# Patient Record
Sex: Female | Born: 1955 | Race: White | Hispanic: No | Marital: Married | Smoking: Never smoker
Health system: Southern US, Community
[De-identification: ages and names within clinical notes are randomized; demographics above are authoritative.]

## PROBLEM LIST (undated history)

## (undated) DIAGNOSIS — K589 Irritable bowel syndrome without diarrhea: Secondary | ICD-10-CM

## (undated) DIAGNOSIS — R896 Abnormal cytological findings in specimens from other organs, systems and tissues: Secondary | ICD-10-CM

## (undated) DIAGNOSIS — F419 Anxiety disorder, unspecified: Secondary | ICD-10-CM

## (undated) DIAGNOSIS — E7800 Pure hypercholesterolemia, unspecified: Secondary | ICD-10-CM

## (undated) DIAGNOSIS — I4510 Unspecified right bundle-branch block: Secondary | ICD-10-CM

## (undated) DIAGNOSIS — M8580 Other specified disorders of bone density and structure, unspecified site: Secondary | ICD-10-CM

## (undated) HISTORY — DX: Pure hypercholesterolemia, unspecified: E78.00

## (undated) HISTORY — DX: Other specified disorders of bone density and structure, unspecified site: M85.80

## (undated) HISTORY — DX: Irritable bowel syndrome, unspecified: K58.9

## (undated) HISTORY — DX: Anxiety disorder, unspecified: F41.9

## (undated) HISTORY — DX: Abnormal cytological findings in specimens from other organs, systems and tissues: R89.6

---

## 1997-10-05 ENCOUNTER — Other Ambulatory Visit: Admission: RE | Admit: 1997-10-05 | Discharge: 1997-10-05 | Payer: Self-pay | Admitting: Gynecology

## 1998-12-21 ENCOUNTER — Other Ambulatory Visit: Admission: RE | Admit: 1998-12-21 | Discharge: 1998-12-21 | Payer: Self-pay | Admitting: Gynecology

## 1999-06-13 ENCOUNTER — Other Ambulatory Visit: Admission: RE | Admit: 1999-06-13 | Discharge: 1999-06-13 | Payer: Self-pay | Admitting: Gynecology

## 2000-02-28 ENCOUNTER — Other Ambulatory Visit: Admission: RE | Admit: 2000-02-28 | Discharge: 2000-02-28 | Payer: Self-pay | Admitting: Gynecology

## 2000-09-11 ENCOUNTER — Other Ambulatory Visit: Admission: RE | Admit: 2000-09-11 | Discharge: 2000-09-11 | Payer: Self-pay | Admitting: Gynecology

## 2001-04-09 ENCOUNTER — Other Ambulatory Visit: Admission: RE | Admit: 2001-04-09 | Discharge: 2001-04-09 | Payer: Self-pay | Admitting: Gynecology

## 2002-04-10 ENCOUNTER — Other Ambulatory Visit: Admission: RE | Admit: 2002-04-10 | Discharge: 2002-04-10 | Payer: Self-pay | Admitting: Gynecology

## 2003-05-13 ENCOUNTER — Other Ambulatory Visit: Admission: RE | Admit: 2003-05-13 | Discharge: 2003-05-13 | Payer: Self-pay | Admitting: Gynecology

## 2003-11-04 ENCOUNTER — Other Ambulatory Visit: Admission: RE | Admit: 2003-11-04 | Discharge: 2003-11-04 | Payer: Self-pay | Admitting: Gynecology

## 2004-05-16 ENCOUNTER — Other Ambulatory Visit: Admission: RE | Admit: 2004-05-16 | Discharge: 2004-05-16 | Payer: Self-pay | Admitting: Gynecology

## 2004-11-25 ENCOUNTER — Other Ambulatory Visit: Admission: RE | Admit: 2004-11-25 | Discharge: 2004-11-25 | Payer: Self-pay | Admitting: Gynecology

## 2005-05-18 ENCOUNTER — Other Ambulatory Visit: Admission: RE | Admit: 2005-05-18 | Discharge: 2005-05-18 | Payer: Self-pay | Admitting: Gynecology

## 2006-09-06 ENCOUNTER — Other Ambulatory Visit: Admission: RE | Admit: 2006-09-06 | Discharge: 2006-09-06 | Payer: Self-pay | Admitting: Gynecology

## 2007-02-07 HISTORY — PX: COLONOSCOPY: SHX174

## 2007-09-09 ENCOUNTER — Other Ambulatory Visit: Admission: RE | Admit: 2007-09-09 | Discharge: 2007-09-09 | Payer: Self-pay | Admitting: Gynecology

## 2008-06-08 ENCOUNTER — Ambulatory Visit: Payer: Self-pay | Admitting: Radiology

## 2008-06-08 ENCOUNTER — Emergency Department (HOSPITAL_BASED_OUTPATIENT_CLINIC_OR_DEPARTMENT_OTHER): Admission: EM | Admit: 2008-06-08 | Discharge: 2008-06-08 | Payer: Self-pay | Admitting: Emergency Medicine

## 2008-09-16 ENCOUNTER — Other Ambulatory Visit: Admission: RE | Admit: 2008-09-16 | Discharge: 2008-09-16 | Payer: Self-pay | Admitting: Gynecology

## 2008-09-16 ENCOUNTER — Encounter: Payer: Self-pay | Admitting: Obstetrics and Gynecology

## 2008-09-16 ENCOUNTER — Ambulatory Visit: Payer: Self-pay | Admitting: Gynecology

## 2008-09-28 ENCOUNTER — Ambulatory Visit: Payer: Self-pay | Admitting: Gynecology

## 2009-09-20 ENCOUNTER — Other Ambulatory Visit: Admission: RE | Admit: 2009-09-20 | Discharge: 2009-09-20 | Payer: Self-pay | Admitting: Gynecology

## 2009-09-20 ENCOUNTER — Ambulatory Visit: Payer: Self-pay | Admitting: Gynecology

## 2009-12-21 ENCOUNTER — Ambulatory Visit: Payer: Self-pay | Admitting: Obstetrics and Gynecology

## 2010-05-15 IMAGING — CR DG CHEST 2V
2 series · 2 of 2 positions shown · non-contrast
Comparison: None

CLINICAL DATA: Heart palpitations/chest pain

CHEST - 2 VIEW

[w chest pa]
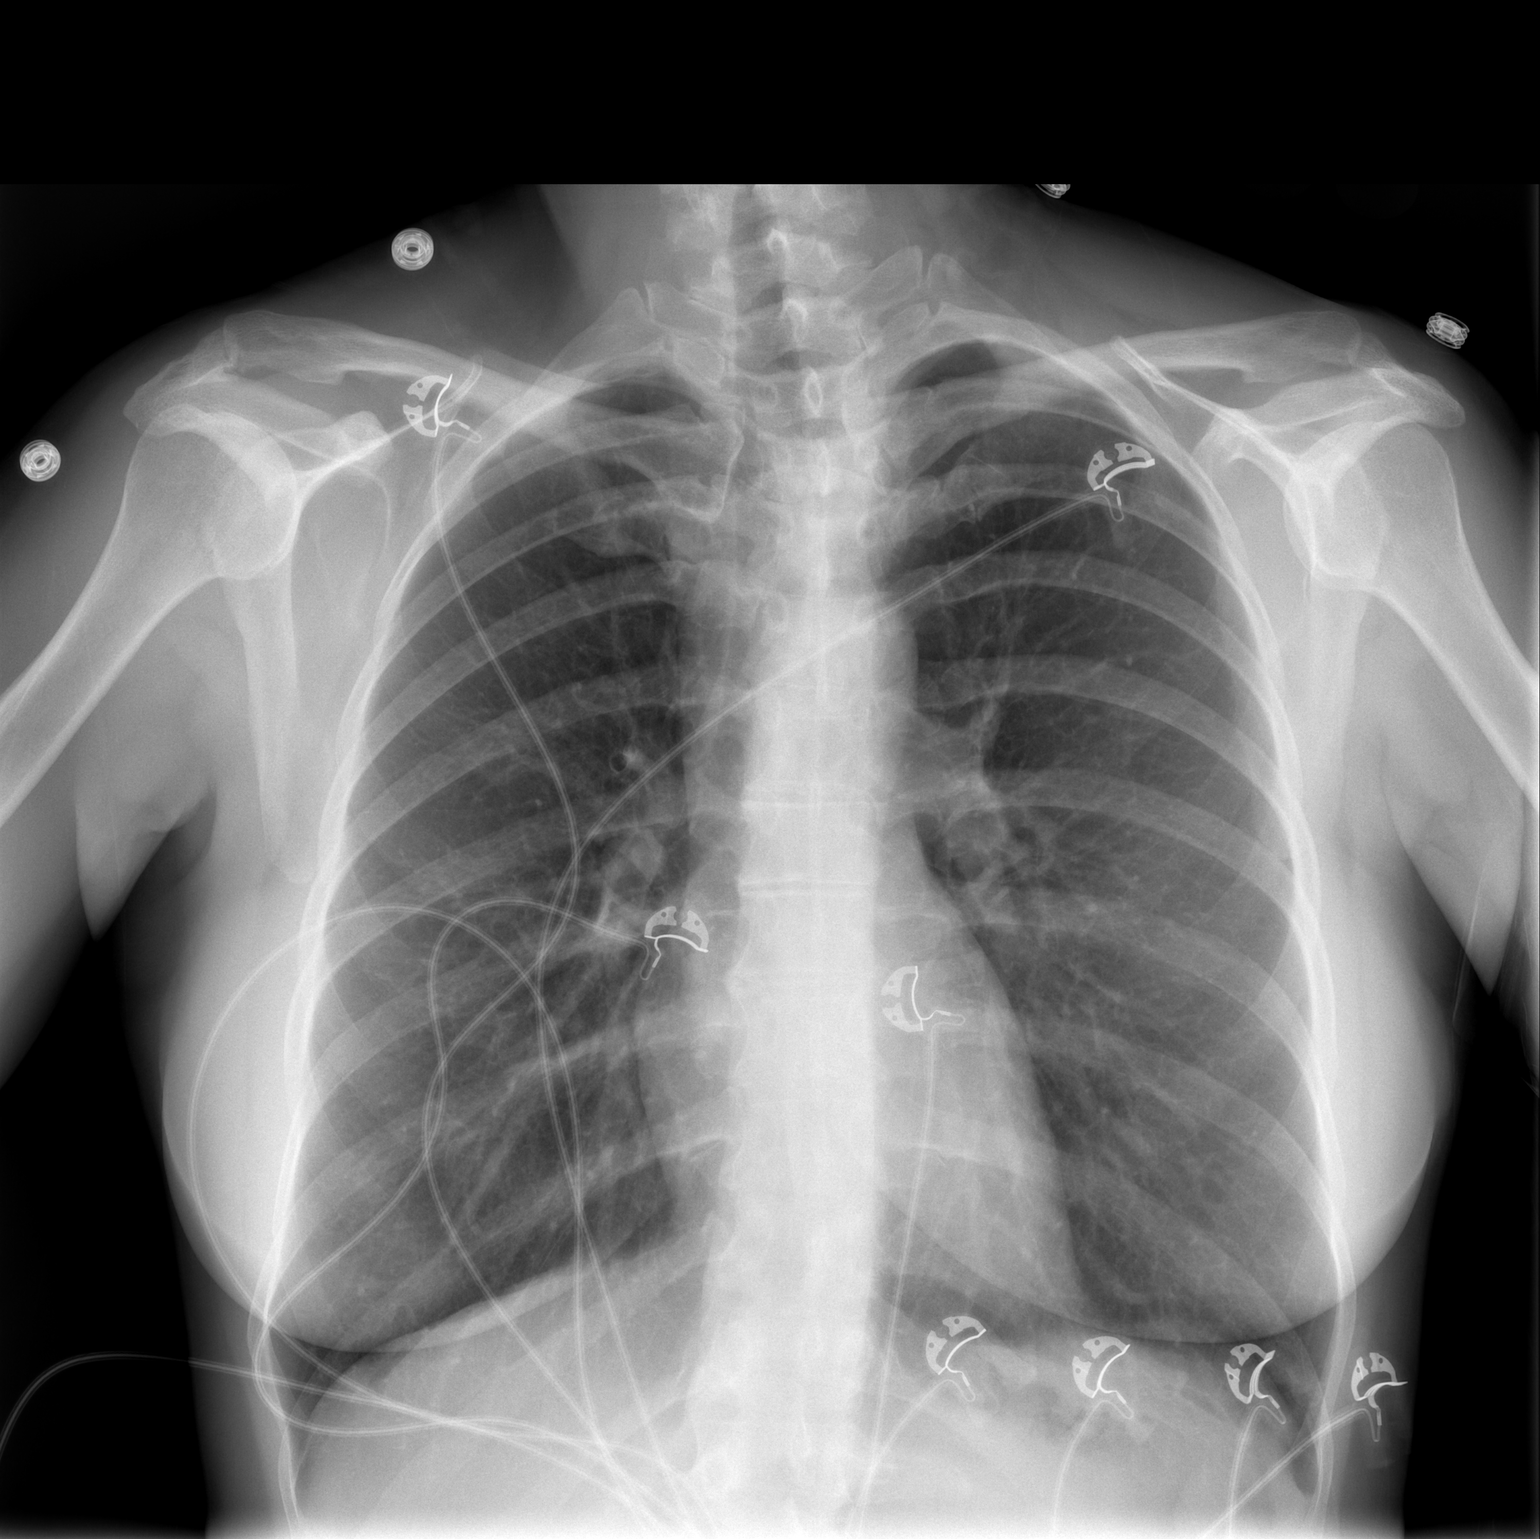

[w chest lat]
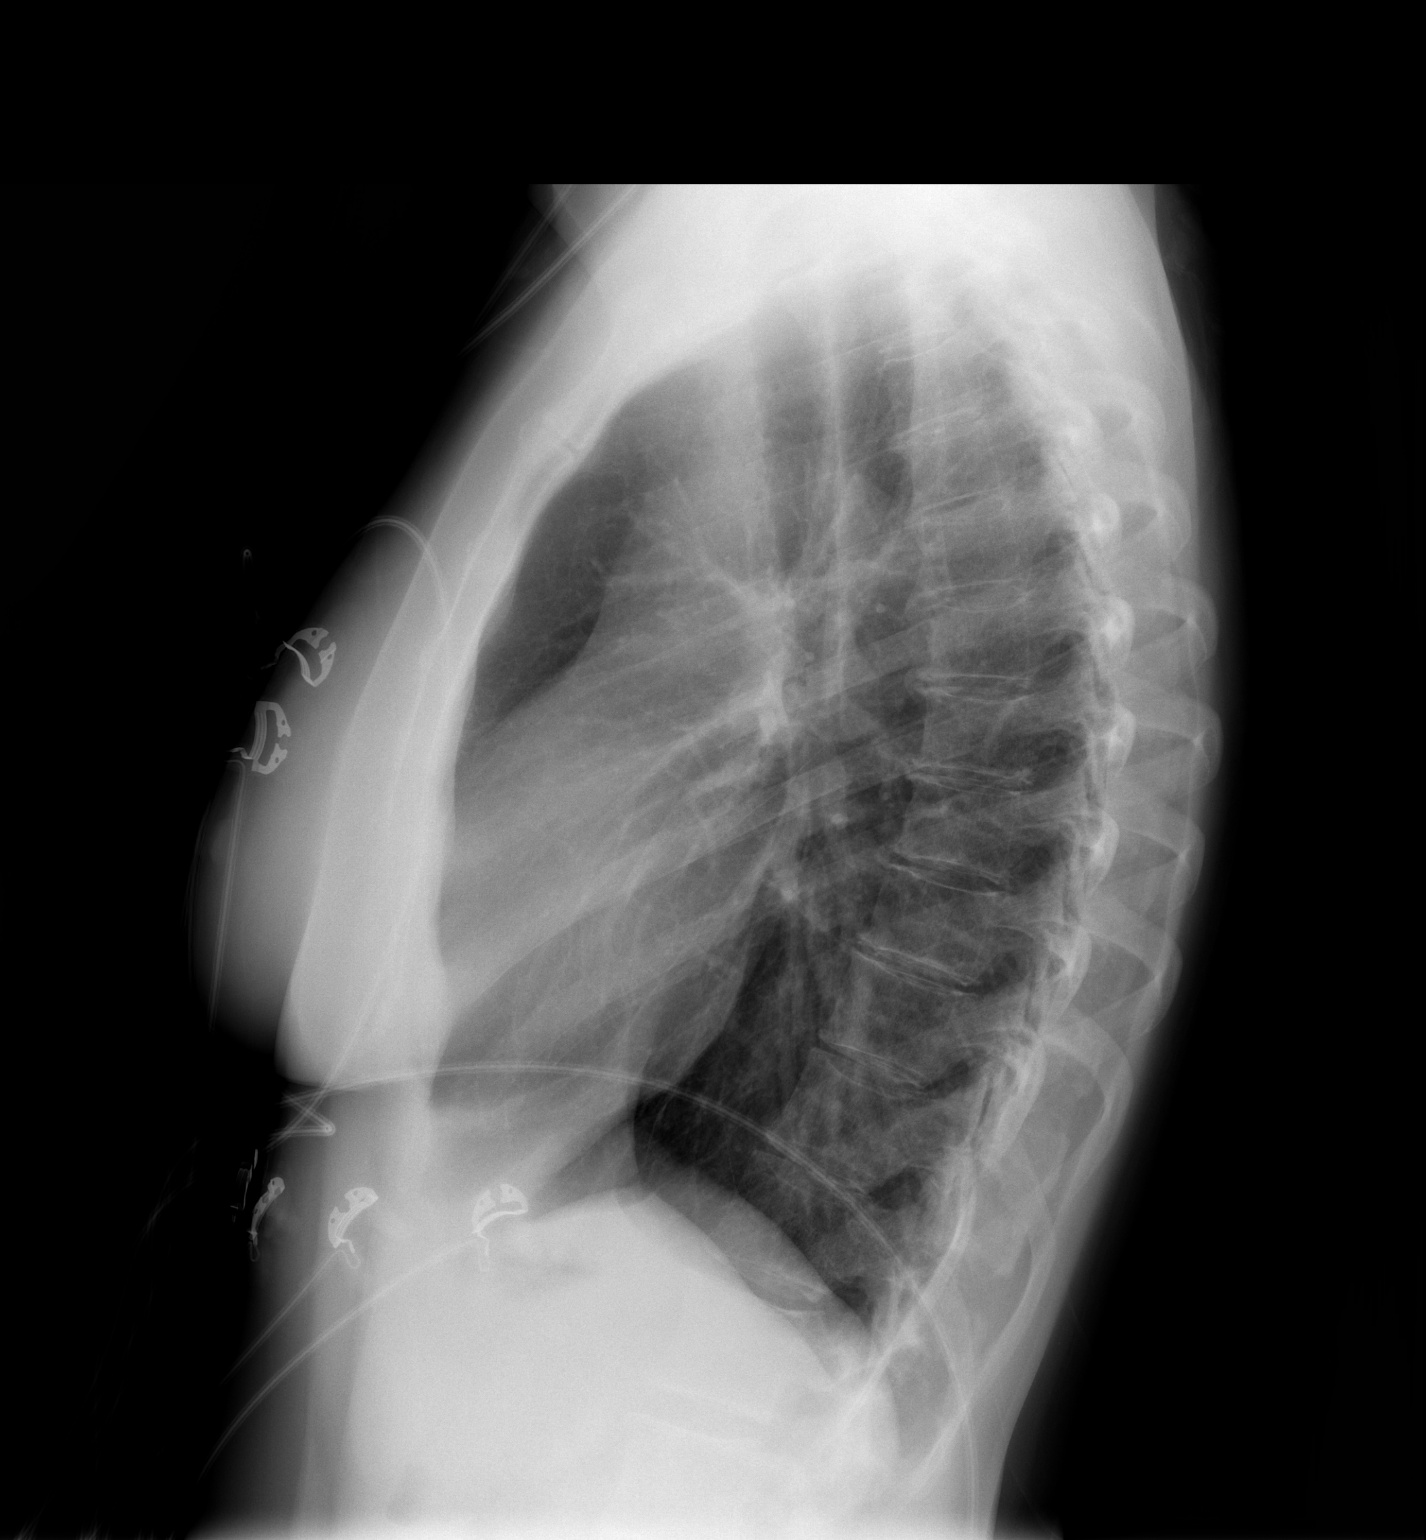

[2 of 2 positions shown; findings below may reference images not displayed]

FINDINGS: The heart and mediastinal contours normal.  Mild
peribronchial thickening without active airspace disease or pleural
fluid.
IMPRESSION: Mild peribronchial thickening - no active disease.

## 2010-05-17 LAB — POCT CARDIAC MARKERS
CKMB, poc: <1 ng/mL — ABNORMAL LOW (ref 1.0–8.0)
Myoglobin, poc: 44.9 ng/mL (ref 12–200)
Troponin i, poc: <0.05 ng/mL (ref 0.00–0.09)

## 2010-05-17 LAB — DIFFERENTIAL
Basophils Absolute: 0 K/uL (ref 0.0–0.1)
Basophils Relative: 0 % (ref 0–1)
Eosinophils Absolute: 0 10*3/uL (ref 0.0–0.7)
Eosinophils Relative: 0 % (ref 0–5)
Lymphocytes Relative: 8 % — ABNORMAL LOW (ref 12–46)
Lymphs Abs: 0.7 K/uL (ref 0.7–4.0)
Monocytes Absolute: 0.2 10*3/uL (ref 0.1–1.0)
Monocytes Relative: 2 % — ABNORMAL LOW (ref 3–12)
Neutro Abs: 7.9 K/uL — ABNORMAL HIGH (ref 1.7–7.7)
Neutrophils Relative %: 89 % — ABNORMAL HIGH (ref 43–77)

## 2010-05-17 LAB — BASIC METABOLIC PANEL WITH GFR
BUN: 14 mg/dL (ref 6–23)
Calcium: 9.7 mg/dL (ref 8.4–10.5)
GFR calc non Af Amer: >60 mL/min (ref >60)
Potassium: 3.9 meq/L (ref 3.5–5.1)
Sodium: 139 meq/L (ref 135–145)

## 2010-05-17 LAB — CBC
HCT: 41.4 % (ref 36.0–46.0)
Hemoglobin: 13.3 g/dL (ref 12.0–15.0)
MCHC: 32.1 g/dL (ref 30.0–36.0)
MCV: 81.1 fL (ref 78.0–100.0)
Platelets: 261 K/uL (ref 150–400)
RBC: 5.11 MIL/uL (ref 3.87–5.11)
RDW: 14.9 % (ref 11.5–15.5)
WBC: 8.8 K/uL (ref 4.0–10.5)

## 2010-05-17 LAB — BASIC METABOLIC PANEL
CO2: 28 mEq/L (ref 19–32)
Chloride: 100 mEq/L (ref 96–112)
Creatinine, Ser: 0.9 mg/dL (ref 0.4–1.2)
GFR calc Af Amer: >60 mL/min (ref >60)
Glucose, Bld: 156 mg/dL — ABNORMAL HIGH (ref 70–99)

## 2010-08-12 ENCOUNTER — Encounter: Payer: Self-pay | Admitting: *Deleted

## 2010-09-07 DIAGNOSIS — IMO0001 Reserved for inherently not codable concepts without codable children: Secondary | ICD-10-CM

## 2010-09-07 HISTORY — DX: Reserved for inherently not codable concepts without codable children: IMO0001

## 2010-09-22 ENCOUNTER — Encounter: Payer: Self-pay | Admitting: Gynecology

## 2010-09-22 ENCOUNTER — Other Ambulatory Visit (HOSPITAL_COMMUNITY)
Admission: RE | Admit: 2010-09-22 | Discharge: 2010-09-22 | Disposition: A | Discharge status: Home / Self Care | Payer: 59 | Source: Ambulatory Visit | Attending: Gynecology | Admitting: Gynecology

## 2010-09-22 ENCOUNTER — Ambulatory Visit (INDEPENDENT_AMBULATORY_CARE_PROVIDER_SITE_OTHER): Payer: 59 | Admitting: Gynecology

## 2010-09-22 VITALS — BP 116/70 | Ht 66.0 in | Wt 160.0 lb

## 2010-09-22 DIAGNOSIS — Z79890 Hormone replacement therapy (postmenopausal): Secondary | ICD-10-CM

## 2010-09-22 DIAGNOSIS — E7800 Pure hypercholesterolemia, unspecified: Secondary | ICD-10-CM | POA: Insufficient documentation

## 2010-09-22 DIAGNOSIS — Z1159 Encounter for screening for other viral diseases: Secondary | ICD-10-CM | POA: Insufficient documentation

## 2010-09-22 DIAGNOSIS — Z01419 Encounter for gynecological examination (general) (routine) without abnormal findings: Secondary | ICD-10-CM

## 2010-09-22 DIAGNOSIS — K589 Irritable bowel syndrome without diarrhea: Secondary | ICD-10-CM | POA: Insufficient documentation

## 2010-09-22 DIAGNOSIS — F419 Anxiety disorder, unspecified: Secondary | ICD-10-CM | POA: Insufficient documentation

## 2010-09-22 MED ORDER — ESTRADIOL-NORETHINDRONE ACET 1-0.5 MG PO TABS: 1.0000 | ORAL_TABLET | Freq: Every day | ORAL | Status: DC

## 2010-09-22 NOTE — Progress Notes (Signed)
Tanya Montoya 11-28-1955 [id number]        55 y.o.  for annual exam.  Primary physician started her about 9 months ago on Activella due to menopausal symptoms hot flushes sweats and emotional swings. Her husband is undergoing treatments for colon cancer and has surgery scheduled tomorrow at [facility]. His already been through radiation and chemotherapy. She is having a lot of anxiety dealing with this and is currently on Effexor and Prozac and Xanax through her primary.  Past medical history,surgical history, allergies, family history and social history were all reviewed and documented in the EPIC chart. ROS:  Was performed and pertinent positives and negatives are included in the history.  Exam: chaperone present Filed Vitals:   09/22/10 1214  BP: 116/70   General appearance  Normal Skin grossly normal Head/Neck normal with no cervical or supraclavicular adenopathy thyroid normal Lungs  clear Cardiac RR, without RMG Abdominal  soft, nontender, without masses, organomegaly or hernia Breasts  examined lying and sitting without masses, retractions, discharge or axillary adenopathy. Pelvic  Ext/BUS/vagina  normal mild atrophic changes  Cervix  normal  Pap done  Uterus  anteverted, normal size, shape and contour, midline and mobile nontender   Adnexa  Without masses or tenderness    Anus and perineum  normal   Rectovaginal  normal sphincter tone without palpated masses or tenderness.    Assessment/Plan:  55 y.o. female for annual exam.    #1 HRT. I discussed the risks benefits of HRT with her to include the WHI study, risks stroke heart attack DVT breast cancer risk all reviewed. At this point as she's doing well and with everything else going on in her life I recommend she stay on Activella for now I refilled her times a year and she agrees with this. At that point we'll see how she's doing and then talk about weaning her off. #2 Health maintenance. Self breast exams on monthly basis discussed  urged.  Had mammo- in February, will continue with annual mammography is. She had a colonoscopy 3 years ago with a recommended repeat in 5 years and she is to followup for that. I gave her a set of OC cards to check for blood in the stool she knows to mail these back call us and make sure we receive them and that they are negative. She had a bone density 2010 which was normal we'll plan on repeating this in another year or 2. Increase calcium vitamin D. I did do blood work is all done through her primary.    Dara Lords MD, 12:50 PM 09/22/2010

## 2010-09-28 ENCOUNTER — Encounter: Payer: Self-pay | Admitting: Gynecology

## 2010-09-28 ENCOUNTER — Telehealth: Payer: Self-pay | Admitting: Gynecology

## 2010-09-28 NOTE — Telephone Encounter (Signed)
Tell patient mild atypia on Pap smear recommend repeat in 6 months

## 2010-09-28 NOTE — Telephone Encounter (Signed)
PT INFORMED WITH THE BELOW NOTE. 

## 2011-01-13 ENCOUNTER — Ambulatory Visit (INDEPENDENT_AMBULATORY_CARE_PROVIDER_SITE_OTHER): Payer: 59 | Admitting: Gynecology

## 2011-01-13 ENCOUNTER — Encounter: Payer: Self-pay | Admitting: Gynecology

## 2011-01-13 DIAGNOSIS — F32A Depression, unspecified: Secondary | ICD-10-CM

## 2011-01-13 DIAGNOSIS — F329 Major depressive disorder, single episode, unspecified: Secondary | ICD-10-CM

## 2011-01-13 DIAGNOSIS — Z23 Encounter for immunization: Secondary | ICD-10-CM

## 2011-01-13 MED ORDER — FLUOXETINE HCL 40 MG PO CAPS: 40.0000 mg | ORAL_CAPSULE | Freq: Every day | ORAL | Status: DC

## 2011-01-13 MED ORDER — SIMVASTATIN 20 MG PO TABS: 20.0000 mg | ORAL_TABLET | ORAL | Status: DC

## 2011-01-13 MED ORDER — ALPRAZOLAM 0.5 MG PO TABS: 0.5000 mg | ORAL_TABLET | Freq: Every evening | ORAL | Status: AC | PRN

## 2011-01-13 NOTE — Progress Notes (Signed)
Patient presents complaining of worsening depression.  Her husband is being treated for colon cancer and he has just underwent surgery and is starting chemotherapy which has been very difficult for them.  She is being seen by her primary and is on Prozac 20 mg daily, they added Effexor for anxiety symptoms also Xanax when necessary and Ambien for sleep. She is no longer sleeping well and just feeling more depressed. She denies suicidal ideation but feels with the holidays coming she's becoming overwhelmed. I reviewed with her that I think she really needs to see a mental health provider as this is a more complex case. In the interim I did suggest we increase her fluoxetine to 40 mg instead of 20 mg that  I would stop the Effexor and wean it over time and go with Xanax 0.5 mg as needed during the day for her anxiety as she seems to do well with this. I do not want to change or her give her anything for sleep and let a mental health provider take over her management and she agrees with this.  We'll go ahead and try to get her in ASAP to see somebody. I asked her that if she felt acutely overwhelmed or certainly with any suicide ideations she needs to call us ASAP or present to an emergency facility. I wrote her for Prozac 40 mg #30 refill times several with Xanax 0.5 mg #60 with 3 refills. I also refilled her Zocor 20 mg at her request #30x3 refills.  She did ask about increasing her HRT and I reviewed with her that I think given she has no hot flashes or sweats that this really has not been to help her mental state and that we will keep her at her current dosage. I did order a TSH just to rule out thyroid contribution to her depression.

## 2011-01-13 NOTE — Patient Instructions (Signed)
Make medication changes as we discussed. Follow up with mental health provider as we will help to arrange.

## 2011-01-18 ENCOUNTER — Telehealth: Payer: Self-pay | Admitting: *Deleted

## 2011-01-18 NOTE — Telephone Encounter (Signed)
Patient said she hasnt been able to call her back but will let us know when her appointment is.

## 2011-01-18 NOTE — Telephone Encounter (Signed)
Message copied by Libby Maw on Wed Jan 18, 2011  3:01 PM ------      Message from: Dara Lords      Created: Fri Jan 13, 2011 12:40 PM       Patient needs referral to [facility] ASAP do to depression. She is on multiple medications and husband has severe cancer.

## 2011-01-18 NOTE — Telephone Encounter (Signed)
Had made call to [facility]. To make appointment on Dec 10.  She lm on patients voice mail but have not gotten a return call back yet. Will try to reach patient.

## 2011-01-23 ENCOUNTER — Telehealth: Payer: Self-pay | Admitting: *Deleted

## 2011-01-23 NOTE — Telephone Encounter (Signed)
Called pt to tell her that behavioral health is booked until Feb 2013. I asked pt if she had a in network list to pick a doctor. Pt will contact insurance to find in network list.

## 2011-01-23 NOTE — Telephone Encounter (Signed)
I would suggest having the patient call [facility] line and ask for a referral. I do not have a list of any primaries who deal with mental health but am sure that [facility] can refer her to the appropriate practitioner.

## 2011-01-23 NOTE — Telephone Encounter (Signed)
Arbutus Ped from [facility] had called back and spoke with patient on 01/20/11 and found out she needed someone to help with managing her meds.  They only have counselors there no md's to monitor meds, but she gave her Dr. Andee Poles and Dr. Rica Mote phone numbers to get her set up with them.

## 2011-01-23 NOTE — Telephone Encounter (Signed)
See telephone encounter 01/18/11. Pt insurance doesn't cover the two doctors listed on encounter. Pt states she doesn't have mental health on her insurance. So pt would like to know if you would recommend any PCP or Intern[facility] to help her. Please advise

## 2011-02-16 ENCOUNTER — Encounter: Payer: Self-pay | Admitting: Gynecology

## 2011-02-16 ENCOUNTER — Ambulatory Visit (INDEPENDENT_AMBULATORY_CARE_PROVIDER_SITE_OTHER): Payer: 59 | Admitting: Gynecology

## 2011-02-16 DIAGNOSIS — N950 Postmenopausal bleeding: Secondary | ICD-10-CM

## 2011-02-16 DIAGNOSIS — R3911 Hesitancy of micturition: Secondary | ICD-10-CM

## 2011-02-16 NOTE — Progress Notes (Signed)
Patient presents complaining of one-day history of vaginal bleeding. No history of this before. She is on Activella HRT. Also notes some hesitancy with difficulty getting started urinating but after starting she does well. No dysuria fever chills low back pain or other symptoms.  Exam with Sherrilyn Rist chaperone present Abdomen soft nontender without masses guarding rebound organomegaly Pelvic external BUS vagina with menses type flow. Cervix normal. Uterus midline mobile normal size nontender. Adnexa without masses or tenderness  Assessment and plan: 1. Postmenopausal bleeding on HRT. Will start with sonohysterogram she will schedule this. Various possibilities up to include a uterine cancer discussed. 2. Hesitancy. Will check UA and culture. She is bleeding and I will check a culture regardless and treat accordingly. If her symptoms persist but she does not have infection I may refer to urology. 3. 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 Her Pap smear in August showed this and we recommend a repeat in 6 months. She'll follow up in a month or two after we resolved the bleeding issue to have this done as she knows to do so.

## 2011-02-16 NOTE — Patient Instructions (Signed)
Follow up for sonogram

## 2011-02-18 LAB — URINE CULTURE

## 2011-02-20 ENCOUNTER — Other Ambulatory Visit: Payer: Self-pay | Admitting: Gynecology

## 2011-02-20 ENCOUNTER — Ambulatory Visit (INDEPENDENT_AMBULATORY_CARE_PROVIDER_SITE_OTHER): Payer: 59 | Admitting: Gynecology

## 2011-02-20 ENCOUNTER — Encounter: Payer: Self-pay | Admitting: Gynecology

## 2011-02-20 ENCOUNTER — Ambulatory Visit (INDEPENDENT_AMBULATORY_CARE_PROVIDER_SITE_OTHER): Payer: 59

## 2011-02-20 DIAGNOSIS — N83339 Acquired atrophy of ovary and fallopian tube, unspecified side: Secondary | ICD-10-CM

## 2011-02-20 DIAGNOSIS — N950 Postmenopausal bleeding: Secondary | ICD-10-CM

## 2011-02-20 DIAGNOSIS — N840 Polyp of corpus uteri: Secondary | ICD-10-CM

## 2011-02-20 NOTE — Patient Instructions (Addendum)
Follow up for biopsy results and decision about where we go from here.

## 2011-02-20 NOTE — Progress Notes (Addendum)
Patient presents for sonohysterogram do to postmenopausal bleeding on HRT. Ultrasound shows endometrial echo of 7.2 mm. Right and left ovaries visualized and atrophic. Negative cul-de-sac. Sonohysterogram was performed sterile technique easy catheter introduction good distention with a small linear area that moves with injection of the fluid back-and-forth and has no feeder eager vessels identified. Endometrial sample taken. Patient tolerated well.  Assessment and plan: Postmenopausal bleeding on HRT. Will follow up for endometrial biopsy results. Significance of this small area unsure. Discussed differential to include debris, synechiae, possible small endometrial polyp. Assuming biopsy benign options to include observation with  resonohysterogram in 3 months versus proceeding with hysteroscopy D&C was reviewed. Patient's going to think of her options and follow up with me when we call her with her biopsy results.

## 2011-02-21 ENCOUNTER — Telehealth: Payer: Self-pay

## 2011-02-21 NOTE — Telephone Encounter (Signed)
Left message for patient to call me with when she would like to schedule her outpatient surgery.

## 2011-02-23 ENCOUNTER — Telehealth: Payer: Self-pay

## 2011-02-23 NOTE — Telephone Encounter (Signed)
Patient called me back 2 days ago but I failed to document our conversation.  I let her know her biopsy result was negative and we proceeded to schedule Hysteroscopy, D&C per Dr. Velvet Bathe.  She was ready to do this as soon as she could so we scheduled her for next Thursday, Jan 24 at 7:30am.  She will come Friday this week to consult with Dr. Velvet Bathe.  Instructions provided.

## 2011-02-24 ENCOUNTER — Ambulatory Visit (INDEPENDENT_AMBULATORY_CARE_PROVIDER_SITE_OTHER): Payer: 59 | Admitting: Gynecology

## 2011-02-24 ENCOUNTER — Encounter: Payer: Self-pay | Admitting: Gynecology

## 2011-02-24 DIAGNOSIS — N950 Postmenopausal bleeding: Secondary | ICD-10-CM

## 2011-02-24 NOTE — H&P (Signed)
  Tanya Montoya 04/26/1955 [id number]   History and Physical    Chief complaint: postmenopausal bleeding on HRT  History of present illness: 56 y.o.  On HRT greater than one year with episode of heavy vaginal bleeding. Sonohysterogram showed an area of irregularity in the uterine cavity that was difficult to determine if this was a synechiae versus polyp. Endometrial sampling showed degenerating benign endometrium. Patient is admitted for hysteroscopy D&C.   Past medical history,surgical history, medications, allergies, family history and social history were all reviewed and documented in the EPIC chart. ROS:  Was performed and pertinent positives and negatives are included in the history of present illness.  Exam: General: well developed, well nourished female, no acute distress HEENT: normal  Lungs: clear to auscultation without wheezing, rales or rhonchi  Cardiac: regular rate without rubs, murmurs or gallops  Abdomen: soft, nontender without masses, guarding, rebound, organomegaly  02/16/2011 exam Pelvic: external bus vagina: normal with menstrual flow bleeding  Cervix: grossly normal  Uterus: normal size, midline and mobile, nontender  Adnexa: without masses or tenderness     Assessment/Plan:  56 year old female on HRT greater than one year with menses type postmenopausal bleeding. Sonohysterogram showed irregularity questionable synechiae versus polyp with endometrial sampling showing benign degenerating endometrium. Options for management to include expectant with resonohysterogram in several months versus proceeding with hysteroscopy D&C now were reviewed the patient wants to proceed with hysteroscopy D&C. I reviewed the proposed surgery with her, the expected intraoperative postoperative courses, instrumentation including the use of the resectoscope and D&C portion of the procedure. The risks to include infection, prolonged antibiotics, bleeding necessitating transfusion and the  risks of transfusion to include transfusion reaction, hepatitis, HIV, mad cow disease and other unknown entities. The risk of uterine perforation, either immediately recognized or delay recognized, causing damage to internal organs including bowel, bladder, ureters, vessels and nerves necessitating major exploratory reparative surgeries and future reparative surgeries including bladder repair, ureteral damage repair, bowel resection, ostomy formation was all reviewed understood and accepted. The risk of distended media absorption leading to metabolic complications such as coma and seizures was also discussed understood and accepted. The patient's questions were answered to her satisfaction she is ready to proceed with surgery.      Dara Lords MD, 10:51 AM 02/24/2011

## 2011-02-24 NOTE — Progress Notes (Signed)
Tanya Montoya Jul 14, 1955 [id number]   The patient presents preop for upcoming hysteroscopy D&C    Chief complaint: postmenopausal bleeding on HRT  History of present illness: 56 y.o.  On HRT greater than one year with episode of heavy vaginal bleeding. Sonohysterogram showed an area of irregularity in the uterine cavity that was difficult to determine if this was a synechiae versus polyp. Endometrial sampling showed degenerating benign endometrium. Patient is admitted for hysteroscopy D&C.   Past medical history,surgical history, medications, allergies, family history and social history were all reviewed and documented in the EPIC chart. ROS:  Was performed and pertinent positives and negatives are included in the history of present illness.  Exam: General: well developed, well nourished female, no acute distress HEENT: normal  Lungs: clear to auscultation without wheezing, rales or rhonchi  Cardiac: regular rate without rubs, murmurs or gallops  Abdomen: soft, nontender without masses, guarding, rebound, organomegaly  02/16/2011 exam Pelvic: external bus vagina: normal with menstrual flow bleeding  Cervix: grossly normal  Uterus: normal size, midline and mobile, nontender  Adnexa: without masses or tenderness     Assessment/Plan:  56 year old female on HRT greater than one year with menses type postmenopausal bleeding. Sonohysterogram showed irregularity questionable synechiae versus polyp with endometrial sampling showing benign degenerating endometrium. Options for management to include expectant with resonohysterogram in several months versus proceeding with hysteroscopy D&C now were reviewed the patient wants to proceed with hysteroscopy D&C. I reviewed the proposed surgery with her, the expected intraoperative postoperative courses, instrumentation including the use of the resectoscope and D&C portion of the procedure. The risks to include infection, prolonged antibiotics, bleeding  necessitating transfusion and the risks of transfusion to include transfusion reaction, hepatitis, HIV, mad cow disease and other unknown entities. The risk of uterine perforation, either immediately recognized or delay recognized, causing damage to internal organs including bowel, bladder, ureters, vessels and nerves necessitating major exploratory reparative surgeries and future reparative surgeries including bladder repair, ureteral damage repair, bowel resection, ostomy formation was all reviewed understood and accepted. The risk of distended media absorption leading to metabolic complications such as coma and seizures was also discussed understood and accepted. The patient's questions were answered to her satisfaction she is ready to proceed with surgery.    Dara Lords MD, 10:45 AM 02/24/2011

## 2011-02-24 NOTE — Patient Instructions (Signed)
Followup for surgery as scheduled. 

## 2011-02-27 ENCOUNTER — Encounter (HOSPITAL_BASED_OUTPATIENT_CLINIC_OR_DEPARTMENT_OTHER): Payer: Self-pay | Admitting: *Deleted

## 2011-02-27 NOTE — Progress Notes (Signed)
NPO AFTER MN. NEEDS HG AND EKG.

## 2011-03-02 ENCOUNTER — Encounter (HOSPITAL_BASED_OUTPATIENT_CLINIC_OR_DEPARTMENT_OTHER): Payer: Self-pay | Admitting: *Deleted

## 2011-03-02 ENCOUNTER — Encounter (HOSPITAL_BASED_OUTPATIENT_CLINIC_OR_DEPARTMENT_OTHER): Payer: Self-pay | Admitting: Gynecology

## 2011-03-02 ENCOUNTER — Ambulatory Visit (HOSPITAL_BASED_OUTPATIENT_CLINIC_OR_DEPARTMENT_OTHER)
Admission: RE | Admit: 2011-03-02 | Discharge: 2011-03-02 | Disposition: A | Discharge status: Home / Self Care | Payer: 59 | Source: Ambulatory Visit | Attending: Gynecology | Admitting: Gynecology

## 2011-03-02 ENCOUNTER — Encounter (HOSPITAL_BASED_OUTPATIENT_CLINIC_OR_DEPARTMENT_OTHER): Payer: Self-pay | Admitting: Anesthesiology

## 2011-03-02 ENCOUNTER — Other Ambulatory Visit: Payer: Self-pay | Admitting: Gynecology

## 2011-03-02 ENCOUNTER — Ambulatory Visit (HOSPITAL_BASED_OUTPATIENT_CLINIC_OR_DEPARTMENT_OTHER): Payer: 59 | Admitting: Anesthesiology

## 2011-03-02 ENCOUNTER — Encounter (HOSPITAL_BASED_OUTPATIENT_CLINIC_OR_DEPARTMENT_OTHER)
Admission: RE | Disposition: A | Discharge status: Home / Self Care | Payer: Self-pay | Source: Ambulatory Visit | Attending: Gynecology

## 2011-03-02 ENCOUNTER — Other Ambulatory Visit: Payer: Self-pay

## 2011-03-02 DIAGNOSIS — N859 Noninflammatory disorder of uterus, unspecified: Secondary | ICD-10-CM | POA: Insufficient documentation

## 2011-03-02 DIAGNOSIS — I499 Cardiac arrhythmia, unspecified: Secondary | ICD-10-CM | POA: Insufficient documentation

## 2011-03-02 DIAGNOSIS — N950 Postmenopausal bleeding: Secondary | ICD-10-CM

## 2011-03-02 HISTORY — PX: HYSTEROSCOPY WITH D & C: SHX1775

## 2011-03-02 HISTORY — DX: Unspecified right bundle-branch block: I45.10

## 2011-03-02 SURGERY — DILATATION AND CURETTAGE /HYSTEROSCOPY
Anesthesia: General | Site: Vagina | Wound class: Clean Contaminated

## 2011-03-02 MED ORDER — OXYCODONE-ACETAMINOPHEN 5-325 MG PO TABS: 1.0000 | ORAL_TABLET | Freq: Four times a day (QID) | ORAL | Status: AC | PRN

## 2011-03-02 MED ORDER — ONDANSETRON HCL 4 MG/2ML IJ SOLN
INTRAMUSCULAR | Status: DC | PRN
  Administered 2011-03-02: 4 mg via INTRAVENOUS

## 2011-03-02 MED ORDER — PROMETHAZINE HCL 25 MG/ML IJ SOLN: 6.2500 mg | INTRAMUSCULAR | Status: DC | PRN

## 2011-03-02 MED ORDER — MIDAZOLAM HCL 5 MG/5ML IJ SOLN
INTRAMUSCULAR | Status: DC | PRN
  Administered 2011-03-02: 1 mg via INTRAVENOUS

## 2011-03-02 MED ORDER — DEXTROSE IN LACTATED RINGERS 5 % IV SOLN: INTRAVENOUS | Status: DC

## 2011-03-02 MED ORDER — FENTANYL CITRATE 0.05 MG/ML IJ SOLN
INTRAMUSCULAR | Status: DC | PRN
  Administered 2011-03-02: 50 ug via INTRAVENOUS
  Administered 2011-03-02 (×2): 25 ug via INTRAVENOUS

## 2011-03-02 MED ORDER — KETOROLAC TROMETHAMINE 30 MG/ML IJ SOLN
INTRAMUSCULAR | Status: DC | PRN
  Administered 2011-03-02: 30 mg via INTRAVENOUS

## 2011-03-02 MED ORDER — DEXTROSE 5 % IV SOLN
1.0000 g | INTRAVENOUS | Status: AC
  Administered 2011-03-02: 1 g via INTRAVENOUS

## 2011-03-02 MED ORDER — LIDOCAINE HCL (CARDIAC) 20 MG/ML IV SOLN
INTRAVENOUS | Status: DC | PRN
  Administered 2011-03-02: 80 mg via INTRAVENOUS

## 2011-03-02 MED ORDER — PROPOFOL 10 MG/ML IV EMUL
INTRAVENOUS | Status: DC | PRN
  Administered 2011-03-02: 200 mg via INTRAVENOUS

## 2011-03-02 MED ORDER — LACTATED RINGERS IV SOLN
INTRAVENOUS | Status: DC
  Administered 2011-03-02: 100 mL/h via INTRAVENOUS
  Administered 2011-03-02 (×2): via INTRAVENOUS

## 2011-03-02 MED ORDER — SODIUM CHLORIDE 0.9 % IR SOLN
Status: DC | PRN
  Administered 2011-03-02: 500 mL

## 2011-03-02 MED ORDER — LIDOCAINE HCL 1 % IJ SOLN
INTRAMUSCULAR | Status: DC | PRN
  Administered 2011-03-02: 10 mL

## 2011-03-02 MED ORDER — GLYCINE 1.5 % IR SOLN
Status: DC | PRN
  Administered 2011-03-02: 6000 mL

## 2011-03-02 MED ORDER — FENTANYL CITRATE 0.05 MG/ML IJ SOLN: 25.0000 ug | INTRAMUSCULAR | Status: DC | PRN

## 2011-03-02 SURGICAL SUPPLY — 38 items
BAG DECANTER FOR FLEXI CONT (MISCELLANEOUS) IMPLANT
CANISTER SUCTION 2500CC (MISCELLANEOUS) ×2 IMPLANT
CATH ROBINSON RED A/P 16FR (CATHETERS) ×2 IMPLANT
CLOTH BEACON ORANGE TIMEOUT ST (SAFETY) ×2 IMPLANT
CORD ACTIVE DISPOSABLE (ELECTRODE) ×1
CORD ELECTRO ACTIVE DISP (ELECTRODE) ×1 IMPLANT
COVER TABLE BACK 60X90 (DRAPES) ×2 IMPLANT
DRAPE CAMERA CLOSED 9X96 (DRAPES) ×2 IMPLANT
DRAPE LG THREE QUARTER DISP (DRAPES) ×2 IMPLANT
DRESSING TELFA 8X3 (GAUZE/BANDAGES/DRESSINGS) ×2 IMPLANT
ELECT LOOP GYNE PRO 24FR (CUTTING LOOP) ×2
ELECT REM PT RETURN 9FT ADLT (ELECTROSURGICAL) ×2
ELECT VAPORTRODE GRVD BAR (ELECTRODE) IMPLANT
ELECTRODE LOOP GYNE PRO 24FR (CUTTING LOOP) ×1 IMPLANT
ELECTRODE REM PT RTRN 9FT ADLT (ELECTROSURGICAL) ×1 IMPLANT
GLOVE BIO SURGEON STRL SZ7.5 (GLOVE) ×4 IMPLANT
GLOVE BIOGEL M 6.5 STRL (GLOVE) ×1 IMPLANT
GLOVE ECLIPSE 6.0 STRL STRAW (GLOVE) ×1 IMPLANT
GOWN PREVENTION PLUS XLARGE (GOWN DISPOSABLE) ×1 IMPLANT
GOWN STRL NON-REIN LRG LVL3 (GOWN DISPOSABLE) ×1 IMPLANT
GOWN W/COTTON TOWEL STD LRG (GOWNS) ×2 IMPLANT
GOWN XL W/COTTON TOWEL STD (GOWNS) ×2 IMPLANT
JUMPSUIT BLUE BOOT COVER DISP (PROTECTIVE WEAR) ×2 IMPLANT
LEGGING LITHOTOMY PAIR STRL (DRAPES) ×2 IMPLANT
NDL SAFETY ECLIPSE 18X1.5 (NEEDLE) IMPLANT
NDL SPNL 22GX3.5 QUINCKE BK (NEEDLE) ×1 IMPLANT
NEEDLE HYPO 18GX1.5 SHARP (NEEDLE)
NEEDLE SPNL 22GX3.5 QUINCKE BK (NEEDLE) ×2 IMPLANT
NS IRRIG 500ML POUR BTL (IV SOLUTION) ×1 IMPLANT
PACK BASIN DAY SURGERY FS (CUSTOM PROCEDURE TRAY) ×2 IMPLANT
PAD OB MATERNITY 4.3X12.25 (PERSONAL CARE ITEMS) ×2 IMPLANT
PAD PREP 24X48 CUFFED NSTRL (MISCELLANEOUS) ×2 IMPLANT
SYR CONTROL 10ML LL (SYRINGE) ×2 IMPLANT
SYR TB 1ML LL NO SAFETY (SYRINGE) IMPLANT
TOWEL OR 17X24 6PK STRL BLUE (TOWEL DISPOSABLE) ×3 IMPLANT
TRAY DSU PREP LF (CUSTOM PROCEDURE TRAY) ×2 IMPLANT
TUBING HYDROFLEX HYSTEROSCOPY (TUBING) ×2 IMPLANT
WATER STERILE IRR 500ML POUR (IV SOLUTION) ×1 IMPLANT

## 2011-03-02 NOTE — Anesthesia Preprocedure Evaluation (Signed)
Anesthesia Evaluation  Patient identified by MRN, date of birth, ID band Patient awake    Reviewed: Allergy & Precautions, H&P , NPO status , Patient's Chart, lab work & pertinent test results  Airway Mallampati: II TM Distance: >3 FB Neck ROM: Full    Dental No notable dental hx.    Pulmonary neg pulmonary ROS,  clear to auscultation  Pulmonary exam normal       Cardiovascular + dysrhythmias Regular Normal    Neuro/Psych PSYCHIATRIC DISORDERS Anxiety Depression Negative Neurological ROS     GI/Hepatic negative GI ROS, Neg liver ROS,   Endo/Other  Negative Endocrine ROS  Renal/GU negative Renal ROS  Genitourinary negative   Musculoskeletal negative musculoskeletal ROS (+)   Abdominal   Peds negative pediatric ROS (+)  Hematology negative hematology ROS (+)   Anesthesia Other Findings   Reproductive/Obstetrics negative OB ROS                           Anesthesia Physical Anesthesia Plan  ASA: II  Anesthesia Plan: General   Post-op Pain Management:    Induction: Intravenous  Airway Management Planned: LMA  Additional Equipment:   Intra-op Plan:   Post-operative Plan: Extubation in OR  Informed Consent: I have reviewed the patients History and Physical, chart, labs and discussed the procedure including the risks, benefits and alternatives for the proposed anesthesia with the patient or authorized representative who has indicated his/her understanding and acceptance.   Dental advisory given  Plan Discussed with: CRNA  Anesthesia Plan Comments:         Anesthesia Quick Evaluation

## 2011-03-02 NOTE — Op Note (Signed)
Tanya Montoya 1955/07/28 [id number]   Post Operative Note   Date of surgery:  03/02/2011  Pre Op Dx: postmenopausal bleeding, endometrial defect  Post Op Dx:   same  Procedure:  Hysteroscopy D&C  Surgeon:  Dara Lords  Anesthesia:  General  EBL:  minimal  Distended media discrepancy:  minimal  Complications:  None  Specimen:  Endometrial curetting to pathology  Findings: EUA:  External BUS vagina normal. Cervix normal. Uterus normal size and shape, midline mobile. Adnexa without masses   Operative:  Hysteroscopy with fluffy anterior fundal flap of tissue not classic polyp, otherwise hysteroscopy was normal with an overall atrophic pattern. Fundus, anterior and posterior uterine surfaces, lower uterine segment, endocervical canal, right and left tubal ostia all visualized.  Procedure:  Patient was taken the operating room, underwent general anesthesia, placed in the low dorsal lithotomy position, received a perineal and vaginal preparation with Betadine solution, EUA performed and the bladder was emptied with an in and out Foley catheterization. Patient was draped in usual fashion and the cervix was visualized with a speculum, anterior lip grasped with a single-tooth tenaculum and a paracervical block using 1% lidocaine was placed a total of 8 cc. The cervix was then gently and gradually dilated to admit the operative hysteroscope and hysteroscopy was performed with findings noted above. A sharp curettage was then performed and specimen sent to pathology. Repeat hysteroscopy showed an empty cavity, good distention with no evidence of perforation. The tenaculum was removed and adequate hemostasis was visualized at the tenaculum site and the external os. The patient was then placed in the supine position, received intraoperative Toradol and was awakened without difficulty, taken to recovery in good condition having tolerated the procedure well.    Dara Lords MD, 7:59 AM  03/02/2011

## 2011-03-02 NOTE — Anesthesia Procedure Notes (Signed)
Procedure Name: LMA Insertion Date/Time: 03/02/2011 7:32 AM Performed by: Lorrin Jackson Pre-anesthesia Checklist: Patient identified, Emergency Drugs available, Suction available and Patient being monitored Patient Re-evaluated:Patient Re-evaluated prior to inductionOxygen Delivery Method: Circle System Utilized Preoxygenation: Pre-oxygenation with 100% oxygen Intubation Type: IV induction Ventilation: Mask ventilation without difficulty LMA: LMA inserted LMA Size: 4.0 Number of attempts: 1 Airway Equipment and Method: bite block Placement Confirmation: positive ETCO2 Tube secured with: Tape Dental Injury: Teeth and Oropharynx as per pre-operative assessment

## 2011-03-02 NOTE — Anesthesia Postprocedure Evaluation (Signed)
  Anesthesia Post-op Note  Patient: Tanya Montoya  Procedure(s) Performed:  DILATATION AND CURETTAGE /HYSTEROSCOPY  Patient Location: PACU  Anesthesia Type: General  Level of Consciousness: awake and alert   Airway and Oxygen Therapy: Patient Spontanous Breathing  Post-op Pain: mild  Post-op Assessment: Post-op Vital signs reviewed, Patient's Cardiovascular Status Stable, Respiratory Function Stable, Patent Airway and No signs of Nausea or vomiting  Post-op Vital Signs: stable  Complications: No apparent anesthesia complications

## 2011-03-02 NOTE — Transfer of Care (Signed)
Immediate Anesthesia Transfer of Care Note  Patient: Tanya Montoya  Procedure(s) Performed:  DILATATION AND CURETTAGE /HYSTEROSCOPY  Patient Location: Patient transported to PACU with oxygen via face mask at 6 Liters / Min  Anesthesia Type: General  Level of Consciousness: awake and alert   Airway & Oxygen Therapy: Patient Spontanous Breathing and Patient connected to face mask oxygen Post-op Assessment: Report given to PACU RN and Post -op Vital signs reviewed and stable  Post vital signs: Reviewed and stable  Complications: No apparent anesthesia complications

## 2011-03-02 NOTE — H&P (Signed)
  The patient was examined.  I reviewed the proposed surgery and consent form with the patient.  The dictated history and physical is current and accurate and all questions were answered. The patient is ready to proceed with surgery and has a realistic understanding and expectation for the outcome.   Dara Lords MD, 7:11 AM 03/02/2011

## 2011-03-03 ENCOUNTER — Encounter (HOSPITAL_BASED_OUTPATIENT_CLINIC_OR_DEPARTMENT_OTHER): Payer: Self-pay | Admitting: Gynecology

## 2011-03-15 ENCOUNTER — Encounter: Payer: Self-pay | Admitting: Gynecology

## 2011-03-15 ENCOUNTER — Ambulatory Visit (INDEPENDENT_AMBULATORY_CARE_PROVIDER_SITE_OTHER): Payer: 59 | Admitting: Gynecology

## 2011-03-15 VITALS — BP 120/70

## 2011-03-15 DIAGNOSIS — Z9889 Other specified postprocedural states: Secondary | ICD-10-CM

## 2011-03-15 NOTE — Patient Instructions (Signed)
Follow up when due for your annual exam. Call if any further bleeding.

## 2011-03-15 NOTE — Progress Notes (Signed)
Postop visit status post hysteroscopy D&C. Reviewed findings of hysteroscopy with her that showed a flap of tissue that ultimately was proliferative endometrium with no definitive polyp. She's done well postoperatively without complaints.  Exam with Selena Batten chaperone present Pelvic external BUS vagina normal. Cervix normal. Uterus normal size midline mobile nontender. Adnexa without masses or tenderness.  Assessment and plan: Episode of postmenopausal bleeding on HRT. Hysteroscopy D&C benign. Patient will keep bleeding calendar if she has any further bleeding will call me but otherwise will follow routinely.

## 2011-03-30 ENCOUNTER — Encounter: Payer: Self-pay | Admitting: Gynecology

## 2011-05-09 ENCOUNTER — Telehealth: Payer: Self-pay | Admitting: *Deleted

## 2011-05-09 MED ORDER — ESTRADIOL-NORETHINDRONE ACET 1-0.5 MG PO TABS: 1.0000 | ORAL_TABLET | Freq: Every day | ORAL | Status: DC

## 2011-05-09 NOTE — Telephone Encounter (Signed)
Pt is calling requesting refill on activella which her PCP prescribed Dr. Wende Bushy but [place] has [place]. Please advise

## 2011-05-09 NOTE — Telephone Encounter (Signed)
Pt informed with the below note. 

## 2011-05-09 NOTE — Telephone Encounter (Signed)
I refilled her through her next annual in the fall.

## 2011-08-29 ENCOUNTER — Other Ambulatory Visit: Payer: Self-pay | Admitting: Gynecology

## 2011-09-17 ENCOUNTER — Other Ambulatory Visit: Payer: Self-pay | Admitting: Gynecology

## 2011-09-18 NOTE — Telephone Encounter (Signed)
Rx called to pharmacy

## 2011-10-31 ENCOUNTER — Other Ambulatory Visit: Payer: Self-pay | Admitting: Gynecology

## 2011-11-14 ENCOUNTER — Ambulatory Visit (INDEPENDENT_AMBULATORY_CARE_PROVIDER_SITE_OTHER): Payer: 59 | Admitting: Gynecology

## 2011-11-14 ENCOUNTER — Other Ambulatory Visit (HOSPITAL_COMMUNITY)
Admission: RE | Admit: 2011-11-14 | Discharge: 2011-11-14 | Disposition: A | Discharge status: Home / Self Care | Payer: 59 | Source: Ambulatory Visit | Attending: Gynecology | Admitting: Gynecology

## 2011-11-14 ENCOUNTER — Encounter: Payer: Self-pay | Admitting: Gynecology

## 2011-11-14 VITALS — BP 120/78 | Ht 66.0 in | Wt 160.0 lb

## 2011-11-14 DIAGNOSIS — Z1151 Encounter for screening for human papillomavirus (HPV): Secondary | ICD-10-CM | POA: Insufficient documentation

## 2011-11-14 DIAGNOSIS — Z01419 Encounter for gynecological examination (general) (routine) without abnormal findings: Secondary | ICD-10-CM

## 2011-11-14 DIAGNOSIS — Z23 Encounter for immunization: Secondary | ICD-10-CM

## 2011-11-14 NOTE — Progress Notes (Signed)
Tanya Montoya [birth year] [id number]        56 y.o.  [id number] for annual exam.  Doing well. Several issues noted below.  Past medical history,surgical history, medications, allergies, family history and social history were all reviewed and documented in the EPIC chart. ROS:  Was performed and pertinent positives and negatives are included in the history.  Exam: [name] assistant Filed Vitals:   11/14/11 1351  BP: 120/78  Height: 5\' 6"  (1.676 m)  Weight: 160 lb (72.576 kg)   General appearance  Normal Skin grossly normal Head/Neck normal with no cervical or supraclavicular adenopathy thyroid normal Lungs  clear Cardiac RR, without RMG Abdominal  soft, nontender, without masses, organomegaly or hernia Breasts  examined lying and sitting without masses, retractions, discharge or axillary adenopathy. Pelvic  Ext/BUS/vagina  normal with atrophic changes  Cervix  normal Pap/HPV  Uterus  anteverted, normal size, shape and contour, midline and mobile nontender   Adnexa  Without masses or tenderness    Anus and perineum  normal   Rectovaginal  normal sphincter tone without palpated masses or tenderness.    Assessment/Plan:  56 y.o. [id number] female for annual exam.   1. Postmenopausal. Patient had initiated HRT previously but stopped it and has done well with occasional hot flashes.  Had episode of postmenopausal bleeding status post hysteroscopy D&C earlier this year with negative pathology. No bleeding since then.  We'll continue to monitor, report any bleeding. 2. Ascus/negative high risk HPV 2012.  No history of significant abnormal Pap smears previously. Pap/HPV done today. 3. Mammography.  Patient had mammogram February 2013. We'll continue with annual mammography. SBE monthly reviewed. 4. DEXA. Patient had normal DEXA 2010. Plan repeat in 2015. Increase calcium vitamin D reviewed. 5. Colonoscopy. Patient had colonoscopy last year. We'll follow but there recommended interval. 6. Health  maintenance. No lab work done this is all done through her primary physician's office. Follow up one year, sooner as needed.    Dara Lords MD, 2:48 PM 11/14/2011

## 2011-11-14 NOTE — Patient Instructions (Signed)
Follow up in one year for annual exam 

## 2011-11-15 LAB — URINALYSIS W MICROSCOPIC + REFLEX CULTURE
Casts: NONE SEEN
Crystals: NONE SEEN
Glucose, UA: NEGATIVE mg/dL
Ketones, ur: NEGATIVE mg/dL
Leukocytes, UA: NEGATIVE
Nitrite: NEGATIVE
Specific Gravity, Urine: 1.018 (ref 1.005–1.030)
pH: 8 (ref 5.0–8.0)

## 2011-11-21 ENCOUNTER — Other Ambulatory Visit: Payer: Self-pay | Admitting: Gynecology

## 2011-11-23 NOTE — Telephone Encounter (Signed)
rx called in [facility] 

## 2011-12-21 ENCOUNTER — Other Ambulatory Visit: Payer: Self-pay | Admitting: Gynecology

## 2011-12-21 NOTE — Telephone Encounter (Signed)
Called into pharmacy

## 2012-04-03 ENCOUNTER — Encounter: Payer: Self-pay | Admitting: Gynecology

## 2012-12-23 ENCOUNTER — Encounter: Payer: Self-pay | Admitting: Gynecology

## 2012-12-23 ENCOUNTER — Ambulatory Visit (INDEPENDENT_AMBULATORY_CARE_PROVIDER_SITE_OTHER): Payer: 59 | Admitting: Gynecology

## 2012-12-23 VITALS — BP 120/70 | Ht 67.0 in | Wt 170.0 lb

## 2012-12-23 DIAGNOSIS — R1032 Left lower quadrant pain: Secondary | ICD-10-CM

## 2012-12-23 DIAGNOSIS — Z01419 Encounter for gynecological examination (general) (routine) without abnormal findings: Secondary | ICD-10-CM

## 2012-12-23 DIAGNOSIS — N952 Postmenopausal atrophic vaginitis: Secondary | ICD-10-CM

## 2012-12-23 DIAGNOSIS — N644 Mastodynia: Secondary | ICD-10-CM

## 2012-12-23 NOTE — Progress Notes (Signed)
Tanya Montoya [birth year] [id number]        57 y.o.  [id number] for annual exam.  Several issues noted below.  Past medical history,surgical history, problem list, medications, allergies, family history and social history were all reviewed and documented in the EPIC chart.  ROS:  Performed and pertinent positives and negatives are included in the history, assessment and plan .  Exam: [facility] assistant Filed Vitals:   12/23/12 1202  BP: 120/70  Height: 5\' 7"  (1.702 m)  Weight: 170 lb (77.111 kg)   General appearance  Normal Skin grossly normal Head/Neck normal with no cervical or supraclavicular adenopathy thyroid normal Lungs  clear Cardiac RR, without RMG Abdominal  soft, nontender, without masses, organomegaly or hernia Breasts  examined lying and sitting without masses, retractions, discharge or axillary adenopathy. Pelvic  Ext/BUS/vagina  normal with atrophic changes  Cervix  normal with atrophic changes  Uterus  anteverted, normal size, shape and contour, midline and mobile nontender   Adnexa  Without masses or tenderness    Anus and perineum  normal   Rectovaginal  normal sphincter tone without palpated masses or tenderness.    Assessment/Plan:  57 y.o. [id number] female for annual exam.   1. Atrophic genital changes. Patient noted some vaginal dryness and dyspareunia. Was given Estrace vaginal cream by her primary physician does not appear to be using it on a regular basis. Options to include OTC products, vaginal estrogen cream, Vagifem, Osphena reviewed. Patient was to try the vaginal estrogen cream more consistently and will use it twice weekly. If she does well once to continue then she'll call me. The issues of absorption with thrombosis risks, breast cancer and uterine stimulation reviewed. Patient knows to report any vaginal bleeding. 2. Mastalgia. Patient notes some bilateral breast discomfort of the last several weeks comes and goes. No nipple discharge or masses. Exam is  normal today. Options for workup to include observation versus mammography now discussed. As it is bilateral and sounds more physiologic the patient's comfortable in monitoring at present. Does continue though she knows to call a for more involved evaluation. Assuming pain disappears and repeat mammogram 03/2013 when due. SBE monthly reviewed. 3. Left lower quadrant discomfort. Deep in the pelvis comes and goes. Not consistent. Has issues with constipation and feels that he correlates when she's constipated. We'll followup with her gastroenterologist in reference to this. 4. Pap smear/HPV negative 2013. No Pap smear done today. Did have ascus with negative high-risk HPV 2012. No history of abnormal Pap smears previously. Plan repeat the Pap smear at 3 year interval. 5. Colonoscopy 2013. Repeat at their recommended interval. 6. DEXA 2010 normal. Repeat next year at five-year interval. Increase calcium vitamin D reviewed. 7. Health maintenance. No blood work done she is being followed for several medical issues and will have her blood work through her primary physician's office. Followup in one year, sooner if her breast discomfort continues.   Note: This document was prepared with digital dictation and possible smart phrase technology. Any transcriptional errors that result from this process are unintentional.   Dara Lords MD, 12:25 PM 12/23/2012

## 2012-12-23 NOTE — Patient Instructions (Signed)
Followup in one year, sooner if your breast discomfort continues.

## 2012-12-24 LAB — URINALYSIS W MICROSCOPIC + REFLEX CULTURE
Bacteria, UA: NONE SEEN
Casts: NONE SEEN
Glucose, UA: NEGATIVE mg/dL
Hgb urine dipstick: NEGATIVE
Ketones, ur: NEGATIVE mg/dL
pH: 6 (ref 5.0–8.0)

## 2012-12-25 LAB — URINE CULTURE: Colony Count: 25000

## 2013-04-07 ENCOUNTER — Encounter: Payer: Self-pay | Admitting: Gynecology

## 2013-12-08 ENCOUNTER — Encounter: Payer: Self-pay | Admitting: Gynecology

## 2014-01-07 ENCOUNTER — Encounter: Payer: Self-pay | Admitting: Gynecology

## 2014-01-07 ENCOUNTER — Ambulatory Visit (INDEPENDENT_AMBULATORY_CARE_PROVIDER_SITE_OTHER): Payer: 59 | Admitting: Gynecology

## 2014-01-07 VITALS — BP 120/76 | Ht 66.0 in | Wt 147.0 lb

## 2014-01-07 DIAGNOSIS — Z01419 Encounter for gynecological examination (general) (routine) without abnormal findings: Secondary | ICD-10-CM

## 2014-01-07 DIAGNOSIS — N952 Postmenopausal atrophic vaginitis: Secondary | ICD-10-CM

## 2014-01-07 NOTE — Patient Instructions (Signed)
You may obtain a copy of any labs that were done today by logging onto MyChart as outlined in the instructions provided with your AVS (after visit summary). The office will not call with normal lab results but certainly if there are any significant abnormalities then we will contact you.   Health Maintenance, Female A healthy lifestyle and preventative care can promote health and wellness.  Maintain regular health, dental, and eye exams.  Eat a healthy diet. Foods like vegetables, fruits, whole grains, low-fat dairy products, and lean protein foods contain the nutrients you need without too many calories. Decrease your intake of foods high in solid fats, added sugars, and salt. Get information about a proper diet from your caregiver, if necessary.  Regular physical exercise is one of the most important things you can do for your health. Most adults should get at least 150 minutes of moderate-intensity exercise (any activity that increases your heart rate and causes you to sweat) each week. In addition, most adults need muscle-strengthening exercises on 2 or more days a week.   Maintain a healthy weight. The body mass index (BMI) is a screening tool to identify possible weight problems. It provides an estimate of body fat based on height and weight. Your caregiver can help determine your BMI, and can help you achieve or maintain a healthy weight. For adults 20 years and older:  A BMI below 18.5 is considered underweight.  A BMI of 18.5 to 24.9 is normal.  A BMI of 25 to 29.9 is considered overweight.  A BMI of 30 and above is considered obese.  Maintain normal blood lipids and cholesterol by exercising and minimizing your intake of saturated fat. Eat a balanced diet with plenty of fruits and vegetables. Blood tests for lipids and cholesterol should begin at age 61 and be repeated every 5 years. If your lipid or cholesterol levels are high, you are over 50, or you are a high risk for heart  disease, you may need your cholesterol levels checked more frequently.Ongoing high lipid and cholesterol levels should be treated with medicines if diet and exercise are not effective.  If you smoke, find out from your caregiver how to quit. If you do not use tobacco, do not start.  Lung cancer screening is recommended for adults aged 33 80 years who are at high risk for developing lung cancer because of a history of smoking. Yearly low-dose computed tomography (CT) is recommended for people who have at least a 30-pack-year history of smoking and are a current smoker or have quit within the past 15 years. A pack year of smoking is smoking an average of 1 pack of cigarettes a day for 1 year (for example: 1 pack a day for 30 years or 2 packs a day for 15 years). Yearly screening should continue until the smoker has stopped smoking for at least 15 years. Yearly screening should also be stopped for people who develop a health problem that would prevent them from having lung cancer treatment.  If you are pregnant, do not drink alcohol. If you are breastfeeding, be very cautious about drinking alcohol. If you are not pregnant and choose to drink alcohol, do not exceed 1 drink per day. One drink is considered to be 12 ounces (355 mL) of beer, 5 ounces (148 mL) of wine, or 1.5 ounces (44 mL) of liquor.  Avoid use of street drugs. Do not share needles with anyone. Ask for help if you need support or instructions about stopping  the use of drugs.  High blood pressure causes heart disease and increases the risk of stroke. Blood pressure should be checked at least every 1 to 2 years. Ongoing high blood pressure should be treated with medicines, if weight loss and exercise are not effective.  If you are 59 to 57 years old, ask your caregiver if you should take aspirin to prevent strokes.  Diabetes screening involves taking a blood sample to check your fasting blood sugar level. This should be done once every 3  years, after age 91, if you are within normal weight and without risk factors for diabetes. Testing should be considered at a younger age or be carried out more frequently if you are overweight and have at least 1 risk factor for diabetes.  Breast cancer screening is essential preventative care for women. You should practice "breast self-awareness." This means understanding the normal appearance and feel of your breasts and may include breast self-examination. Any changes detected, no matter how small, should be reported to a caregiver. Women in their 66s and 30s should have a clinical breast exam (CBE) by a caregiver as part of a regular health exam every 1 to 3 years. After age 101, women should have a CBE every year. Starting at age 100, women should consider having a mammogram (breast X-ray) every year. Women who have a family history of breast cancer should talk to their caregiver about genetic screening. Women at a high risk of breast cancer should talk to their caregiver about having an MRI and a mammogram every year.  Breast cancer gene (BRCA)-related cancer risk assessment is recommended for women who have family members with BRCA-related cancers. BRCA-related cancers include breast, ovarian, tubal, and peritoneal cancers. Having family members with these cancers may be associated with an increased risk for harmful changes (mutations) in the breast cancer genes BRCA1 and BRCA2. Results of the assessment will determine the need for genetic counseling and BRCA1 and BRCA2 testing.  The Pap test is a screening test for cervical cancer. Women should have a Pap test starting at age 57. Between ages 25 and 35, Pap tests should be repeated every 2 years. Beginning at age 37, you should have a Pap test every 3 years as long as the past 3 Pap tests have been normal. If you had a hysterectomy for a problem that was not cancer or a condition that could lead to cancer, then you no longer need Pap tests. If you are  between ages 50 and 76, and you have had normal Pap tests going back 10 years, you no longer need Pap tests. If you have had past treatment for cervical cancer or a condition that could lead to cancer, you need Pap tests and screening for cancer for at least 20 years after your treatment. If Pap tests have been discontinued, risk factors (such as a new sexual partner) need to be reassessed to determine if screening should be resumed. Some women have medical problems that increase the chance of getting cervical cancer. In these cases, your caregiver may recommend more frequent screening and Pap tests.  The human papillomavirus (HPV) test is an additional test that may be used for cervical cancer screening. The HPV test looks for the virus that can cause the cell changes on the cervix. The cells collected during the Pap test can be tested for HPV. The HPV test could be used to screen women aged 44 years and older, and should be used in women of any age  who have unclear Pap test results. After the age of 55, women should have HPV testing at the same frequency as a Pap test.  Colorectal cancer can be detected and often prevented. Most routine colorectal cancer screening begins at the age of 44 and continues through age 20. However, your caregiver may recommend screening at an earlier age if you have risk factors for colon cancer. On a yearly basis, your caregiver may provide home test kits to check for hidden blood in the stool. Use of a small camera at the end of a tube, to directly examine the colon (sigmoidoscopy or colonoscopy), can detect the earliest forms of colorectal cancer. Talk to your caregiver about this at age 86, when routine screening begins. Direct examination of the colon should be repeated every 5 to 10 years through age 13, unless early forms of pre-cancerous polyps or small growths are found.  Hepatitis C blood testing is recommended for all people born from [birth year] through 1964 and any  individual with known risks for hepatitis C.  Practice safe sex. Use condoms and avoid high-risk sexual practices to reduce the spread of sexually transmitted infections (STIs). Sexually active women aged 36 and younger should be checked for Chlamydia, which is a common sexually transmitted infection. Older women with new or multiple partners should also be tested for Chlamydia. Testing for other STIs is recommended if you are sexually active and at increased risk.  Osteoporosis is a disease in which the bones lose minerals and strength with aging. This can result in serious bone fractures. The risk of osteoporosis can be identified using a bone density scan. Women ages 20 and over and women at risk for fractures or osteoporosis should discuss screening with their caregivers. Ask your caregiver whether you should be taking a calcium supplement or vitamin D to reduce the rate of osteoporosis.  Menopause can be associated with physical symptoms and risks. Hormone replacement therapy is available to decrease symptoms and risks. You should talk to your caregiver about whether hormone replacement therapy is right for you.  Use sunscreen. Apply sunscreen liberally and repeatedly throughout the day. You should seek shade when your shadow is shorter than you. Protect yourself by wearing long sleeves, pants, a wide-brimmed hat, and sunglasses year round, whenever you are outdoors.  Notify your caregiver of new moles or changes in moles, especially if there is a change in shape or color. Also notify your caregiver if a mole is larger than the size of a pencil eraser.  Stay current with your immunizations. Document Released: 08/08/2010 Document Revised: 05/20/2012 Document Reviewed: 08/08/2010 [facility] Patient Information 2014 [employer].

## 2014-01-07 NOTE — Progress Notes (Signed)
Tanya GillsCathy Montoya 07/31/1955 [id number]        58 y.o.  [id number] for annual exam.  Several issues noted below.  Past medical history,surgical history, problem list, medications, allergies, family history and social history were all reviewed and documented as reviewed in the EPIC chart.  ROS:  12 system ROS performed with pertinent positives and negatives included in the history, assessment and plan.   Additional significant findings :  none   Exam: [facility] personassistant Filed Vitals:   01/07/14 1121  BP: 120/76  Height: 5\' 6"  (1.676 m)  Weight: 147 lb (66.679 kg)   General appearance:  Normal affect, orientation and appearance. Skin: Grossly normal HEENT: Without gross lesions.  No cervical or supraclavicular adenopathy. Thyroid normal.  Lungs:  Clear without wheezing, rales or rhonchi Cardiac: RR, without RMG Abdominal:  Soft, nontender, without masses, guarding, rebound, organomegaly or hernia Breasts:  Examined lying and sitting without masses, retractions, discharge or axillary adenopathy. Pelvic:  Ext/BUS/vagina with generalized atrophic changes.  Cervix with atrophic changes  Uterus anteverted, normal size, shape and contour, midline and mobile nontender   Adnexa  Without masses or tenderness    Anus and perineum  Normal   Rectovaginal  Normal sphincter tone without palpated masses or tenderness.    Assessment/Plan:  58 y.o. [id number] female for annual exam.   1. Postmenopausal/atrophic genital changes. Patient was using estrogen cream but stopped it and is doing well with OTC lubricants. Not having significant hot flashes or night sweats. No vaginal bleeding. Continue to monitor and report any vaginal bleeding. 2. IBS symptoms. Patient is having bouts of diarrhea and constipation. She asked in general about ovarian cancer screening.  No strong family history of breast or ovarian cancer. Reports an elderly paternal aunt with ovarian cancer but she also had "uterine cancer".  The patient's  exam is normal. Options for screening including CA-125 and ultrasounds reviewed. Lack of evidence in low risk population discussed. Patient does not want to proceed with screening.  Will follow up with her gastroenterologist in reference to her diarrhea and constipation. Reports colonoscopy 2013. 3. Mammography 03/2013. Continue with annual mammography. SBE monthly reviewed. 4. DEXA 2010 normal. Recommend repeat at age 58. Increase calcium vitamin D reviewed. 5. Pap smear/HPV negative 2013. No Pap smear done today.  Did have ASCUS negative high-risk HPV in 2012. Plan repeat Pap smear next year at 3 year interval. 6. Health maintenance. No routine blood work done as she reports this done at her primary physician's office. Follow up in one year, sooner as needed.     Dara LordsFONTAINE,Tanya Montoya P MD, 11:45 AM 01/07/2014

## 2014-01-08 LAB — URINALYSIS W MICROSCOPIC + REFLEX CULTURE
Bacteria, UA: NONE SEEN
Bilirubin Urine: NEGATIVE
Casts: NONE SEEN
Crystals: NONE SEEN
Glucose, UA: NEGATIVE mg/dL
HGB URINE DIPSTICK: NEGATIVE
Ketones, ur: NEGATIVE mg/dL
LEUKOCYTES UA: NEGATIVE
Nitrite: NEGATIVE
PROTEIN: NEGATIVE mg/dL
SQUAMOUS EPITHELIAL / LPF: NONE SEEN
Specific Gravity, Urine: 1.011 (ref 1.005–1.030)
UROBILINOGEN UA: 0.2 mg/dL (ref 0.0–1.0)
pH: 5.5 (ref 5.0–8.0)

## 2014-04-10 ENCOUNTER — Encounter: Payer: Self-pay | Admitting: Gynecology

## 2014-09-07 DEATH — deceased

## 2015-04-01 ENCOUNTER — Encounter: Payer: Self-pay | Admitting: Gynecology

## 2015-04-01 ENCOUNTER — Telehealth: Payer: Self-pay | Admitting: *Deleted

## 2015-04-01 ENCOUNTER — Other Ambulatory Visit (HOSPITAL_COMMUNITY)
Admission: RE | Admit: 2015-04-01 | Discharge: 2015-04-01 | Disposition: A | Discharge status: Home / Self Care | Payer: 59 | Source: Ambulatory Visit | Attending: Gynecology | Admitting: Gynecology

## 2015-04-01 ENCOUNTER — Ambulatory Visit (INDEPENDENT_AMBULATORY_CARE_PROVIDER_SITE_OTHER): Payer: 59 | Admitting: Gynecology

## 2015-04-01 VITALS — BP 114/66 | Ht 66.0 in | Wt 168.0 lb

## 2015-04-01 DIAGNOSIS — Z113 Encounter for screening for infections with a predominantly sexual mode of transmission: Secondary | ICD-10-CM

## 2015-04-01 DIAGNOSIS — Z01419 Encounter for gynecological examination (general) (routine) without abnormal findings: Secondary | ICD-10-CM | POA: Insufficient documentation

## 2015-04-01 DIAGNOSIS — N952 Postmenopausal atrophic vaginitis: Secondary | ICD-10-CM

## 2015-04-01 NOTE — Telephone Encounter (Signed)
Left message on voicemail that she left without labs (I didn't name labs in voicemail) told her to schedule lab appointment if she wants done here. Order placed.

## 2015-04-01 NOTE — Addendum Note (Signed)
Addended by: Dara Lords on: 04/01/2015 03:07 PM   Modules accepted: Orders

## 2015-04-01 NOTE — Progress Notes (Signed)
  Tanya Montoya 01-11-1956 [id number]        60 y.o.  [id number]  for annual exam.  Well without complaints  Past medical history,surgical history, problem list, medications, allergies, family history and social history were all reviewed and documented as reviewed in the EPIC chart.  ROS:  Performed with pertinent positives and negatives included in the history, assessment and plan.   Additional significant findings :  none   Exam: [name] assistant Filed Vitals:   04/01/15 1411  BP: 114/66  Height:  (1.676 m)  Weight: 168 lb (76.204 kg)   General appearance:  Normal affect, orientation and appearance. Skin: Grossly normal HEENT: Without gross lesions.  No cervical or supraclavicular adenopathy. Thyroid normal.  Lungs:  Clear without wheezing, rales or rhonchi Cardiac: RR, without RMG Abdominal:  Soft, nontender, without masses, guarding, rebound, organomegaly or hernia Breasts:  Examined lying and sitting without masses, retractions, discharge or axillary adenopathy. Pelvic:  Ext/BUS/vagina with atrophic changes  Cervix with atrophic changes  Uterus anteverted, normal size, shape and contour, midline and mobile nontender   Adnexa without masses or tenderness    Anus and perineum normal   Rectovaginal normal sphincter tone without palpated masses or tenderness.    Assessment/Plan:  60 y.o. [id number] female for annual exam..   1.  Postmenopausal/atrophic genital changes.  Having some vaginal dryness but using OTC lubricants. No significant hot flashes or night sweats. No vaginal bleeding. Continue to monitor report any issues or vaginal bleeding. 2. Mammography coming due in March and I reminded her to schedule this. SBE monthly reviewed. 3. Pap smear 2013. Pap smear done today. No history of significant abnormal Pap smears previously. 4. Colonoscopy 2013. Repeat at their recommended interval. 5. DEXA 2010 normal. Plan repeat at age 4.  Increased calcium and vitamin  D. 6. Health maintenance.  No routine blood work done as this is done at her primary physician's office. Follow up 1 year, sooner as needed.   Dara Lords MD, 2:39 PM 04/01/2015

## 2015-04-01 NOTE — Patient Instructions (Signed)

## 2015-04-01 NOTE — Addendum Note (Signed)
Addended by: Dayna Barker on: 04/01/2015 02:48 PM   Modules accepted: Orders

## 2015-04-01 NOTE — Telephone Encounter (Signed)
-----   Message from Dara Lords, MD sent at 04/01/2015  3:07 PM EST ----- Patient and I talked about doing a hepatitis C screen but she left without doing. If she would like to have this done then she can come back and get a hepatitis C and HIV screen or she can arrange to do through her primary physician's office whichever she chooses.

## 2015-04-02 LAB — URINALYSIS W MICROSCOPIC + REFLEX CULTURE
Bacteria, UA: NONE SEEN [HPF]
Bilirubin Urine: NEGATIVE
CASTS: NONE SEEN [LPF]
CRYSTALS: NONE SEEN [HPF]
Glucose, UA: NEGATIVE
HGB URINE DIPSTICK: NEGATIVE
KETONES UR: NEGATIVE
NITRITE: NEGATIVE
PROTEIN: NEGATIVE
Specific Gravity, Urine: 1.019 (ref 1.001–1.035)
YEAST: NONE SEEN [HPF]
pH: 6 (ref 5.0–8.0)

## 2015-04-05 ENCOUNTER — Other Ambulatory Visit: Payer: Self-pay | Admitting: Gynecology

## 2015-04-05 LAB — URINE CULTURE

## 2015-04-05 MED ORDER — CIPROFLOXACIN HCL 250 MG PO TABS: 250.0000 mg | ORAL_TABLET | Freq: Two times a day (BID) | ORAL | Status: DC

## 2015-04-06 ENCOUNTER — Other Ambulatory Visit: Payer: 59

## 2015-04-06 ENCOUNTER — Other Ambulatory Visit: Payer: Self-pay | Admitting: Gynecology

## 2015-04-06 DIAGNOSIS — Z113 Encounter for screening for infections with a predominantly sexual mode of transmission: Secondary | ICD-10-CM

## 2015-04-06 LAB — CBC WITH DIFFERENTIAL/PLATELET
BASOS PCT: 1 % (ref 0–1)
Basophils Absolute: 0 10*3/uL (ref 0.0–0.1)
Eosinophils Absolute: 0 10*3/uL (ref 0.0–0.7)
Eosinophils Relative: 1 % (ref 0–5)
HCT: 43.6 % (ref 36.0–46.0)
HEMOGLOBIN: 14.6 g/dL (ref 12.0–15.0)
Lymphocytes Relative: 29 % (ref 12–46)
Lymphs Abs: 1.3 10*3/uL (ref 0.7–4.0)
MCH: 28.7 pg (ref 26.0–34.0)
MCHC: 33.5 g/dL (ref 30.0–36.0)
MCV: 85.8 fL (ref 78.0–100.0)
MPV: 9.2 fL (ref 8.6–12.4)
Monocytes Absolute: 0.3 10*3/uL (ref 0.1–1.0)
Monocytes Relative: 7 % (ref 3–12)
NEUTROS ABS: 2.7 10*3/uL (ref 1.7–7.7)
NEUTROS PCT: 62 % (ref 43–77)
Platelets: 230 10*3/uL (ref 150–400)
RBC: 5.08 MIL/uL (ref 3.87–5.11)
RDW: 14.2 % (ref 11.5–15.5)
WBC: 4.4 10*3/uL (ref 4.0–10.5)

## 2015-04-06 LAB — LIPID PANEL
CHOLESTEROL: 176 mg/dL (ref 125–200)
HDL: 75 mg/dL (ref >=46)
LDL CALC: 85 mg/dL (ref <130)
TRIGLYCERIDES: 80 mg/dL (ref <150)
Total CHOL/HDL Ratio: 2.3 Ratio (ref <=5.0)
VLDL: 16 mg/dL (ref <30)

## 2015-04-06 LAB — COMPREHENSIVE METABOLIC PANEL
ALBUMIN: 4.2 g/dL (ref 3.6–5.1)
ALT: 22 U/L (ref 6–29)
AST: 27 U/L (ref 10–35)
Alkaline Phosphatase: 73 U/L (ref 33–130)
BILIRUBIN TOTAL: 0.6 mg/dL (ref 0.2–1.2)
BUN: 15 mg/dL (ref 7–25)
CO2: 26 mmol/L (ref 20–31)
CREATININE: 1.11 mg/dL — AB (ref 0.50–1.05)
Calcium: 9.3 mg/dL (ref 8.6–10.4)
Chloride: 102 mmol/L (ref 98–110)
Glucose, Bld: 90 mg/dL (ref 65–99)
Potassium: 4.3 mmol/L (ref 3.5–5.3)
SODIUM: 139 mmol/L (ref 135–146)
TOTAL PROTEIN: 6.6 g/dL (ref 6.1–8.1)

## 2015-04-06 LAB — CYTOLOGY - PAP

## 2015-04-06 LAB — HEPATITIS C ANTIBODY: HCV Ab: NEGATIVE

## 2015-04-07 LAB — HIV ANTIBODY (ROUTINE TESTING W REFLEX): HIV: NONREACTIVE

## 2015-04-14 ENCOUNTER — Encounter: Payer: Self-pay | Admitting: Gynecology

## 2015-10-19 ENCOUNTER — Encounter: Payer: Self-pay | Admitting: Gynecology

## 2016-04-03 ENCOUNTER — Encounter: Payer: Self-pay | Admitting: Gynecology

## 2016-04-03 ENCOUNTER — Ambulatory Visit (INDEPENDENT_AMBULATORY_CARE_PROVIDER_SITE_OTHER): Payer: 59 | Admitting: Gynecology

## 2016-04-03 VITALS — BP 124/74 | Ht 67.0 in | Wt 161.0 lb

## 2016-04-03 DIAGNOSIS — Z01411 Encounter for gynecological examination (general) (routine) with abnormal findings: Secondary | ICD-10-CM

## 2016-04-03 DIAGNOSIS — F411 Generalized anxiety disorder: Secondary | ICD-10-CM | POA: Diagnosis not present

## 2016-04-03 DIAGNOSIS — N952 Postmenopausal atrophic vaginitis: Secondary | ICD-10-CM | POA: Diagnosis not present

## 2016-04-03 MED ORDER — ALPRAZOLAM 0.5 MG PO TABS: 0.5000 mg | ORAL_TABLET | Freq: Three times a day (TID) | ORAL | 0 refills | Status: DC | PRN

## 2016-04-03 NOTE — Patient Instructions (Signed)

## 2016-04-03 NOTE — Progress Notes (Signed)
    Tanya Montoya September 30, 1955 [id number]        61 y.o.  [id number] for annual exam.    Past medical history,surgical history, problem list, medications, allergies, family history and social history were all reviewed and documented as reviewed in the EPIC chart.  ROS:  Performed with pertinent positives and negatives included in the history, assessment and plan.   Additional significant findings :  None   Exam: Kennon PortelaKim Gardner assistant Vitals:   04/03/16 1004  BP: 124/74  Weight: 161 lb (73 kg)  Height: 5\' 7"  (1.702 m)   Body mass index is 25.22 kg/m.  General appearance:  Normal affect, orientation and appearance. Skin: Grossly normal HEENT: Without gross lesions.  No cervical or supraclavicular adenopathy. Thyroid normal.  Lungs:  Clear without wheezing, rales or rhonchi Cardiac: RR, without RMG Abdominal:  Soft, nontender, without masses, guarding, rebound, organomegaly or hernia Breasts:  Examined lying and sitting without masses, retractions, discharge or axillary adenopathy. Pelvic:  Ext, BUS, Vagina: With atrophic changes  Cervix: With atrophic changes  Uterus: Anteverted, normal size, shape and contour, midline and mobile nontender   Adnexa: Without masses or tenderness    Anus and perineum: Normal   Rectovaginal: Normal sphincter tone without palpated masses or tenderness.    Assessment/Plan:  61 y.o. [id number] female for annual exam.   1. Postmenopausal/atrophic genital changes. No significant hot flushes, night sweats, vaginal dryness or any bleeding. Continue to monitor report any issues or bleeding. 2. History of OAB. Actively seen a urologist through [facility]. Does not feel she is getting adequate response to her medication. Recommended she follow up with her urologist and she agrees to do so. 3. Situational anxiety. Husband having melanoma excised. Mother just placed in nursing home. Father staying at home and they're helping to arrange this. Requested short course  of anxiolytic. She has an appointment next week to see her psychiatrist who actively manages her depression. Xanax 0.5 mg #30 one by mouth every 6 hours when necessary anxiety provided with no refills. Has used this in the past with good results and no significant side effects. Follow up with psychiatrist as scheduled. 4. Mammography 10/2015. Continue with annual mammography when due. SBE monthly reviewed. 5. DEXA 2010 normal. Plan repeat DEXA next year at age 61. 796. Pap smear 03/2015. No Pap smear done today. No history of significant abnormal Pap smears. Plan repeat Pap smear at 3 year interval per current screening guidelines. 7. Colonoscopy 2013. Patient's going to call Dr. Jennye BoroughsMedoff's office to see when she needs to repeat this. 8. Health maintenance. No routine lab work done as patient reports this done elsewhere. Follow up 1 year, sooner as needed.  Dara LordsFONTAINE,Isaac Lacson P MD, 10:26 AM 04/03/2016

## 2016-04-14 ENCOUNTER — Encounter: Payer: Self-pay | Admitting: Gynecology

## 2016-05-31 ENCOUNTER — Other Ambulatory Visit: Payer: Self-pay | Admitting: Gynecology

## 2016-06-01 NOTE — Telephone Encounter (Signed)
2

## 2016-06-02 NOTE — Telephone Encounter (Signed)
Called into pharmacy

## 2017-04-04 ENCOUNTER — Encounter: Payer: 59 | Admitting: Gynecology

## 2017-08-17 ENCOUNTER — Ambulatory Visit: Payer: BLUE CROSS/BLUE SHIELD | Admitting: Gynecology

## 2017-08-17 ENCOUNTER — Encounter: Payer: Self-pay | Admitting: Gynecology

## 2017-08-17 VITALS — BP 122/78 | Ht 66.5 in | Wt 167.0 lb

## 2017-08-17 DIAGNOSIS — N952 Postmenopausal atrophic vaginitis: Secondary | ICD-10-CM

## 2017-08-17 DIAGNOSIS — Z01419 Encounter for gynecological examination (general) (routine) without abnormal findings: Secondary | ICD-10-CM | POA: Diagnosis not present

## 2017-08-17 NOTE — Addendum Note (Signed)
Addended by: Dayna BarkerGARDNER, KIMBERLY K on: 08/17/2017 11:35 AM   Modules accepted: Orders

## 2017-08-17 NOTE — Patient Instructions (Signed)
Follow-up for bone density as scheduled  Follow-up for annual exam in 1 year   

## 2017-08-17 NOTE — Progress Notes (Signed)
    Tanya GillsCathy Blomgren 1955/12/19 [id number]        62 y.o.  [id number] for annual gynecologic exam.  Without gynecologic complaints  Past medical history,surgical history, problem list, medications, allergies, family history and social history were all reviewed and documented as reviewed in the EPIC chart.  ROS:  Performed with pertinent positives and negatives included in the history, assessment and plan.   Additional significant findings : None   Exam: Kennon PortelaKim Gardner assistant Vitals:   08/17/17 1028  BP: 122/78  Weight: 167 lb (75.8 kg)  Height: 5' 6.5" (1.689 m)   Body mass index is 26.55 kg/m.  General appearance:  Normal affect, orientation and appearance. Skin: Grossly normal HEENT: Without gross lesions.  No cervical or supraclavicular adenopathy. Thyroid normal.  Lungs:  Clear without wheezing, rales or rhonchi Cardiac: RR, without RMG Abdominal:  Soft, nontender, without masses, guarding, rebound, organomegaly or hernia Breasts:  Examined lying and sitting without masses, retractions, discharge or axillary adenopathy. Pelvic:  Ext, BUS, Vagina: With atrophic changes  Cervix: With atrophic changes.  Pap smear done  Uterus: Anteverted, normal size, shape and contour, midline and mobile nontender   Adnexa: Without masses or tenderness    Anus and perineum: Normal   Rectovaginal: Normal sphincter tone without palpated masses or tenderness.    Assessment/Plan:  62 y.o. [id number] female for annual gynecologic exam.   1. Postmenopausal/atrophic genital changes.  No significant menopausal symptoms or vaginal bleeding.  Continue to monitor and report any issues or bleeding. 2. Pap smear 2017.  Pap smear done today.  No history of abnormal Pap smears.  Continue screening at 3-year intervals per current screening guidelines. 3. Mammography listed 04/2016.  Patient is going to check to see when she did her last mammogram and will follow-up if she is due now.  Breast exam normal  today. 4. Colonoscopy 2013.  Repeat at their recommended interval. 5. DEXA 2010 normal.  Recommend follow-up DEXA now at age 62 and patient is going to schedule. 6. Health maintenance.  No routine lab work done as patient reports this done elsewhere.  Follow-up 1 year, sooner as needed.   Dara Lordsimothy P Dione Petron MD, 11:00 AM 08/17/2017

## 2017-08-22 LAB — PAP IG W/ RFLX HPV ASCU

## 2017-08-22 LAB — HUMAN PAPILLOMAVIRUS, HIGH RISK: HPV DNA HIGH RISK: NOT DETECTED

## 2017-08-23 ENCOUNTER — Encounter: Payer: Self-pay | Admitting: Gynecology

## 2017-09-06 DIAGNOSIS — M8580 Other specified disorders of bone density and structure, unspecified site: Secondary | ICD-10-CM

## 2017-09-06 HISTORY — DX: Other specified disorders of bone density and structure, unspecified site: M85.80

## 2017-09-11 ENCOUNTER — Ambulatory Visit (INDEPENDENT_AMBULATORY_CARE_PROVIDER_SITE_OTHER): Payer: BLUE CROSS/BLUE SHIELD

## 2017-09-11 ENCOUNTER — Other Ambulatory Visit: Payer: Self-pay | Admitting: Gynecology

## 2017-09-11 DIAGNOSIS — M8588 Other specified disorders of bone density and structure, other site: Secondary | ICD-10-CM

## 2017-09-11 DIAGNOSIS — Z01419 Encounter for gynecological examination (general) (routine) without abnormal findings: Secondary | ICD-10-CM

## 2017-09-11 DIAGNOSIS — M85851 Other specified disorders of bone density and structure, right thigh: Secondary | ICD-10-CM

## 2017-09-11 DIAGNOSIS — Z13820 Encounter for screening for osteoporosis: Secondary | ICD-10-CM

## 2017-09-12 ENCOUNTER — Telehealth: Payer: Self-pay | Admitting: Gynecology

## 2017-09-12 ENCOUNTER — Encounter: Payer: Self-pay | Admitting: Gynecology

## 2017-09-12 DIAGNOSIS — M8580 Other specified disorders of bone density and structure, unspecified site: Secondary | ICD-10-CM

## 2017-09-12 NOTE — Telephone Encounter (Signed)
Patient aware, order placed for vitamin d level, coming on 09/13/17 @ 9:30am

## 2017-09-12 NOTE — Telephone Encounter (Signed)
Tell patient her most recent bone density shows minimal osteopenia.  I do recommend checking a vitamin D level to make sure she is in the therapeutic range as this is something we could adjust if she is low.  Weightbearing exercise on a regular basis such as walking as well as adequate calcium intake also were important.  I would recommend repeating the bone density in 2 years

## 2017-09-13 ENCOUNTER — Other Ambulatory Visit: Payer: BLUE CROSS/BLUE SHIELD

## 2017-09-13 DIAGNOSIS — M8580 Other specified disorders of bone density and structure, unspecified site: Secondary | ICD-10-CM

## 2017-09-14 LAB — VITAMIN D 25 HYDROXY (VIT D DEFICIENCY, FRACTURES): Vit D, 25-Hydroxy: 40 ng/mL (ref 30–100)

## 2018-11-05 ENCOUNTER — Encounter: Payer: Self-pay | Admitting: Gynecology

## 2019-05-01 ENCOUNTER — Ambulatory Visit: Payer: Self-pay | Attending: Internal Medicine

## 2019-05-01 DIAGNOSIS — Z23 Encounter for immunization: Secondary | ICD-10-CM

## 2019-05-01 NOTE — Progress Notes (Signed)
   Covid-19 Vaccination Clinic  Name:  Garnet Chatmon    MRN: [id number] DOB: Mar 24, 1955  05/01/2019  Ms. Favaro was observed post Covid-19 immunization for 15 minutes without incident. She was provided with Vaccine Information Sheet and instruction to access the V-Safe system.   Ms. Coval was instructed to call 911 with any severe reactions post vaccine: Marland Kitchen Difficulty breathing  . Swelling of face and throat  . A fast heartbeat  . A bad rash all over body  . Dizziness and weakness   Immunizations Administered    Name Date Dose VIS Date Route   Pfizer COVID-19 Vaccine 05/01/2019  8:35 AM 0.3 mL 01/17/2019 Intramuscular   Manufacturer: [employer], [employer]   Lot: [id number]   NDC: [address]-4

## 2019-05-26 ENCOUNTER — Ambulatory Visit: Payer: Self-pay | Attending: Internal Medicine

## 2019-05-26 DIAGNOSIS — Z23 Encounter for immunization: Secondary | ICD-10-CM

## 2019-05-26 NOTE — Progress Notes (Signed)
   Covid-19 Vaccination Clinic  Name:  Tanya Montoya    MRN: [id number] DOB: 1955/09/28  05/26/2019  [name] was observed post Covid-19 immunization for 15 minutes without incident. She was provided with Vaccine Information Sheet and instruction to access the V-Safe system.   [name] was instructed to call 911 with any severe reactions post vaccine: Marland Kitchen Difficulty breathing  . Swelling of face and throat  . A fast heartbeat  . A bad rash all over body  . Dizziness and weakness   Immunizations Administered    Name Date Dose VIS Date Route   Pfizer COVID-19 Vaccine 05/26/2019  8:17 AM 0.3 mL 04/02/2018 Intramuscular   Manufacturer: [employer], [employer]   Lot: [id number]   NDC: [address]-3

## 2020-06-06 DEATH — deceased
